# Patient Record
Sex: Female | Born: 2005 | Hispanic: Refuse to answer | Marital: Single | Smoking: Never smoker
Health system: Southern US, Community
[De-identification: ages and names within clinical notes are randomized; demographics above are authoritative.]

## PROBLEM LIST (undated history)

## (undated) DIAGNOSIS — L309 Dermatitis, unspecified: Secondary | ICD-10-CM

## (undated) DIAGNOSIS — E079 Disorder of thyroid, unspecified: Secondary | ICD-10-CM

## (undated) DIAGNOSIS — E7800 Pure hypercholesterolemia, unspecified: Secondary | ICD-10-CM

## (undated) DIAGNOSIS — N83209 Unspecified ovarian cyst, unspecified side: Secondary | ICD-10-CM

## (undated) DIAGNOSIS — J45909 Unspecified asthma, uncomplicated: Secondary | ICD-10-CM

## (undated) HISTORY — PX: OOPHORECTOMY: SHX86

---
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
## 2024-01-11 ENCOUNTER — Ambulatory Visit (INDEPENDENT_AMBULATORY_CARE_PROVIDER_SITE_OTHER): Admitting: "Endocrinology

## 2024-01-11 ENCOUNTER — Encounter: Payer: Self-pay | Admitting: "Endocrinology

## 2024-01-11 VITALS — BP 102/70 | HR 85 | Ht 65.5 in | Wt 243.0 lb

## 2024-01-11 DIAGNOSIS — E782 Mixed hyperlipidemia: Secondary | ICD-10-CM

## 2024-01-11 DIAGNOSIS — Z87898 Personal history of other specified conditions: Secondary | ICD-10-CM | POA: Diagnosis not present

## 2024-01-11 DIAGNOSIS — E221 Hyperprolactinemia: Secondary | ICD-10-CM | POA: Diagnosis not present

## 2024-01-11 DIAGNOSIS — E66812 Obesity, class 2: Secondary | ICD-10-CM

## 2024-01-11 DIAGNOSIS — Z6839 Body mass index (BMI) 39.0-39.9, adult: Secondary | ICD-10-CM

## 2024-01-11 DIAGNOSIS — Z8349 Family history of other endocrine, nutritional and metabolic diseases: Secondary | ICD-10-CM | POA: Diagnosis not present

## 2024-01-11 LAB — PROLACTIN: Prolactin: 13 ng/mL

## 2024-01-11 NOTE — Progress Notes (Signed)
 Outpatient Endocrinology Note Jamie Birmingham, MD    Jamie Davidson 04/03/2006 [id number]  Referring Provider: Willo Rosina Davidson* Primary Care Provider: Doreene Davidson PARAS, MD Reason for consultation: Subjective   Assessment & Plan  Diagnoses and all orders for this visit:  Hyperprolactinemia -     Prolactin  Family history of thyroid  disease  Mixed hypercholesterolemia and hypertriglyceridemia  History of prediabetes  Class 2 severe obesity due to excess calories with serious comorbidity and body mass index (BMI) of 39.0 to 39.9 in adult   She also reported galactorrhea in the past with mild hyperprolactinemia since 12/2022 Of note, 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   She had negative thyroid  antibodies.  She has an aunt with thyroid  problem per records, patient doesn't recall.  Recommend annual TSH with PCP  Has mixed hyperlipidemia and history of pre-diabetes  She is not currently on any lipid/diabetes meds.  Recommend low fat, low carb diet and activity  Seen nutritionist in past, not interested now Maintain healthy lifestyle including 1200 Cal/day, 30 min of activity/day, avoiding refined/processed/outside food 20 minutes physical activity per day, in continuum or interruptedly through the day  Goals: less than 60 grams of carbohydrate/meal, 1200-1500 Cal/day, 10,0000 steps a day and weight loss of 0.5-1 lb/ wk  Sleep 7-9 hours/day, adapt good sleep hygiene Adapt de-stressing and relaxation techniques to prevent stress induced weight gain Avoid/switch medications that lead to weight gain by discussing with the prescribing physician     Lab Results  Component Value Date   HGBA1C 5.6 06/23/2023   HGBA1C 5.9 (H)  12/25/2022   HGBA1C 5.7 (H) 06/22/2022  Lipid Panel     Component Value Date/Time   CHOL 193 (H) 06/23/2023 0927   TRIG 131 (H) 06/23/2023 0927   HDL 40 (L) 06/23/2023 0927   CHOLHDL 4.8 06/23/2023 0927   LDLCALC 128 (H) 06/23/2023 0927     Return in about 3 months (around 04/11/2024) for visit, labs today.   I have reviewed current medications, nurse's notes, allergies, vital signs, past medical and surgical history, family medical history, and social history for this encounter. Counseled patient on symptoms, examination findings, lab findings, imaging results, treatment decisions and monitoring and prognosis. The patient understood the recommendations and agrees with the treatment plan. All questions regarding treatment plan were fully answered.  Jamie Birmingham, MD  01/11/24   History of Present Illness HPI  Jamie Davidson is a 18 y.o. year old female who presents for evaluation of hyperprolactinemia, history of pre-diabetes   Has history of Hashimoto's but never been on thyroid  medication Has history of minimal breast discharge at one point, mildly high prolactin, none now Reports some headaches, wears glasses and reports vision issues, due  S/p B/L oophorectomy (2019, 2022) for ovarian cyst/bilat borderline tumor on estradiol  and progesterone replacement (managed by GYN)  Has seen a nutritionist  Physical Exam  BP 102/70   Pulse 85   Ht 5' 5.5 (1.664 m)   Wt 243 lb (110.2 kg)   SpO2 96%   BMI 39.82 kg/m    Constitutional: well developed, well nourished Head: normocephalic, atraumatic Eyes: sclera anicteric, no redness Neck: supple Lungs: normal respiratory effort Neurology: alert and oriented Skin: dry, no appreciable rashes Musculoskeletal: no appreciable defects Psychiatric:  normal mood and affect   Current Medications Patient's Medications  New Prescriptions   No medications on file  Previous Medications   CETIRIZINE (ZYRTEC) 10 MG TABLET     Take 10 mg by mouth at bedtime.   CHOLECALCIFEROL 1.25 MG (50000 UT) CAPSULE    take 1 capsule by mouth once weekly for 6 weeks   ESTRADIOL  (CLIMARA  - DOSED IN MG/24 HR) 0.0375 MG/24HR PATCH    0.0375 mg once a week.   FLUTICASONE (FLONASE) 50 MCG/ACT NASAL SPRAY    Place into both nostrils.   NORETHINDRONE  (AYGESTIN ) 5 MG TABLET    Take 5 mg by mouth.   PROCHLORPERAZINE (COMPAZINE) 10 MG TABLET    Take 10 mg by mouth every 6 (six) hours as needed for refractory nausea / vomiting.   VENTOLIN  HFA 108 (90 BASE) MCG/ACT INHALER    Inhale 2 puffs into the lungs every 4 (four) hours as needed.  Modified Medications   No medications on file  Discontinued Medications   No medications on file    Allergies Allergies  Allergen Reactions   Cat Dander    Dog Epithelium (Canis Lupus Familiaris)    Other     pollen   Pollen Extract     Past Medical History Past Medical History:  Diagnosis Date   Asthma    Eczema    Elevated cholesterol    Ovarian cyst    Thyroid  disease     Past Surgical History Past Surgical History:  Procedure Laterality Date   OOPHORECTOMY      Family History family history includes Epilepsy in her maternal grandmother.  Social History Social History   Socioeconomic History   Marital status: Single    Spouse name: Not on file   Number of children: Not on file   Years of education: Not on file   Highest education level: Not on file  Occupational History   Not on file  Tobacco Use   Smoking status: Never   Smokeless tobacco: Never  Substance and Sexual Activity   Alcohol use: No   Drug use: No   Sexual activity: Not on file  Other Topics Concern   Not on file  Social History Narrative   She attends [employer] -24-25 school year      She lives with mom, dad, younger sister, and brother.    Social Drivers of Corporate investment banker Strain: Not on File (07/31/2021)   Received from [employer]    Financial Resource Strain: 0   Food Insecurity: Not at Risk (09/08/2022)   Received from [employer]    Food: 1  Transportation Needs: Not at Risk (09/08/2022)   Received from [employer] [facility]    Transportation: 1  Physical Activity: Not on File (07/31/2021)   Received from [facility]   Physical Activity    Physical Activity: 0  Stress: Not on File (07/31/2021)   Received from [facility]   Stress    Stress: 0  Social Connections: Not on File (12/21/2022)   Received from [facility]   Social Connections    Connectedness: 0  Intimate Partner Violence: Not on file    Lab Results  Component Value Date   CHOL 193 (H) 06/23/2023   Lab Results  Component Value Date   HDL 40 (L) 06/23/2023   Lab Results  Component Value Date   LDLCALC 128 (H) 06/23/2023   Lab Results  Component Value Date  TRIG 131 (H) 06/23/2023   Lab Results  Component Value Date   CHOLHDL 4.8 06/23/2023   Lab Results  Component Value Date   CREATININE 0.68 12/25/2022   No results found for: GFR    Component Value Date/Time   NA 138 12/25/2022 0811   K 4.4 12/25/2022 0811   CL 104 12/25/2022 0811   CO2 23 12/25/2022 0811   GLUCOSE 83 12/25/2022 0811   BUN 10 12/25/2022 0811   CREATININE 0.68 12/25/2022 0811   CALCIUM 9.0 12/25/2022 0811   PROT 7.1 12/25/2022 0811   ALBUMIN 3.8 10/06/2020 1130   AST 14 12/25/2022 0811   ALT 17 12/25/2022 0811   ALKPHOS 89 10/06/2020 1130   BILITOT 0.4 12/25/2022 0811   GFRNONAA NOT CALCULATED 10/06/2020 1108   GFRAA NOT CALCULATED 04/25/2018 1320      Latest Ref Rng & Units 12/25/2022    8:11 AM 10/06/2020   11:08 AM 04/25/2018    1:20 PM  BMP  Glucose 65 - 139 mg/dL 83  892  87   BUN 7 - 20 mg/dL 10  8  7    Creatinine 0.50 - 0.96 mg/dL 9.31  9.40  9.38   BUN/Creat Ratio 6 - 22 (calc) SEE NOTE:     Sodium 135 - 146 mmol/L 138  137  139   Potassium 3.8 - 5.1 mmol/L 4.4  4.0  3.6   Chloride 98 - 110 mmol/L 104  104  109   CO2 20 - 32 mmol/L 23  23  22    Calcium 8.9 - 10.4  mg/dL 9.0  9.1  9.0        Component Value Date/Time   WBC 11.5 10/06/2020 1108   RBC 4.89 10/06/2020 1108   HGB 13.2 10/06/2020 1108   HCT 41.2 10/06/2020 1108   PLT 360 10/06/2020 1108   MCV 84.3 10/06/2020 1108   MCH 27.0 10/06/2020 1108   MCHC 32.0 10/06/2020 1108   RDW 14.6 10/06/2020 1108   LYMPHSABS 2.4 10/06/2020 1108   MONOABS 0.6 10/06/2020 1108   EOSABS 0.1 10/06/2020 1108   BASOSABS 0.1 10/06/2020 1108   Lab Results  Component Value Date   TSH 5.46 (H) 06/23/2023   TSH 4.29 12/25/2022   TSH 4.02 06/22/2022   FREET4 1.1 06/23/2023   FREET4 1.0 12/25/2022   FREET4 1.0 06/22/2022         Parts of this note may have been dictated using voice recognition software. There may be variances in spelling and vocabulary which are unintentional. Not all errors are proofread. Please notify the dino if any discrepancies are noted or if the meaning of any statement is not clear.

## 2024-01-11 NOTE — Patient Instructions (Signed)
 10-Point Nutrition Plan  Control portions of carbohydrate - 30 grams/meal, 15 grams/snack. Choose low-glycemic carbohydrates. Avoid high-glycemic carbohydrates. Include (heart-healthy) fats in each meal or snack - 15 grams/meal, 5 grams/snack. Emphasize optimal protein intake. Space meals/snacks 3-4 hours apart. Avoid consuming liquids with meals. Avoid alcohol. Avoid caffeine. Maintain post-bariatric vitamin and mineral intake.    Low Glycemic Index Carbohydrates (CHOOSE) Steel-cut oats (regular, not quick-cook or instant) Oat bran cereal Beans/legumes (e.g., garbanzo, navy, kidney, lima, pinto, black-eyed and pea beans, edamame (soybeans), lentils Bean products (e.g., hummus, tofu) Pearled barley, cooked al dente Yams Some fruits (e.g., grapefruit, apples, pears, berries, apricots, peaches) Some pasta (e.g., Barilla Plus pasta), cooked al dente Some whole grain breads (e.g., Ezekiel bread, Joseph's Flax, Oat Bran & Whole Wheat Pita/Lavash/Tortillas) Some whole grain crackers (e.g., RyKrisp, RyVita, Wasa) Brown rice, wild rice Quinoa  Buckwheat (a grass)   High Glycemic Index Carbohydrates (AVOID) Refined breakfast cereals (e.g., Corn Flakes, Rice Krispies, Cream of Rice, instant oatmeal) Regular pasta Most starchy vegetables (e.g., white potatoes, corn, winter (orange) squash) White rice, rice cakes Popcorn, pretzels, chips Some fruits (e.g., ripe bananas, pineapple, mango, watermelon, grapes) All fruit juices and sweetened drinks (e.g., sodas, sweetened iced tea) Bread, rolls, bagels, English muffins, and crackers made with refined flour Sweets (e.g., candy, cake, cookies, ice cream, syrup)   Heart-Healthy Fats (Adapt) Nuts, nut butters Avocado, guacamole Olives Most plant oils (e.g., olive, canola, peanut, soy, sunflower, sesame) Most seeds (e.g., sunflower, flax, sesame/sesame tahini) Oily fish (e.g., salmon, bluefish, mackerel, tuna, sardines)

## 2024-04-11 ENCOUNTER — Encounter: Payer: Self-pay | Admitting: "Endocrinology

## 2024-04-11 ENCOUNTER — Telehealth: Admitting: "Endocrinology

## 2024-04-11 DIAGNOSIS — E6609 Other obesity due to excess calories: Secondary | ICD-10-CM | POA: Diagnosis not present

## 2024-04-11 DIAGNOSIS — Z8349 Family history of other endocrine, nutritional and metabolic diseases: Secondary | ICD-10-CM | POA: Diagnosis not present

## 2024-04-11 DIAGNOSIS — E782 Mixed hyperlipidemia: Secondary | ICD-10-CM

## 2024-04-11 NOTE — Progress Notes (Signed)
 "  The patient reports they are currently: [name]. I spent 10-11 minutes on the  with the patient on the date of service. I spent an additional 2 minutes on pre- and post-visit activities on the date of service.   The patient was physically located in [place]  or a state in which I am permitted to provide care. The patient and/or parent/guardian understood that s/he may incur co-pays and cost sharing, and agreed to the telemedicine visit. The visit was reasonable and appropriate under the circumstances given the patient's presentation at the time.  The patient and/or parent/guardian understands the potential risks and limitations of this mode of treatment (including, but not limited to, the absence of in-person examination) and has agreed to be treated using telemedicine. The patient's/patient's family's questions regarding telemedicine have been answered.   The patient and/or parent/guardian will contact their provider's office for worsening conditions, and seek emergency medical treatment and/or call 911 if the patient deems either necessary.     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   She had negative thyroid  antibodies.  She has an aunt with thyroid  problem per records, patient doesn't recall.   Recommend annual TSH   Has mixed hyperlipidemia and history of pre-diabetes  She is not currently on any lipid/diabetes meds, and not interested in weight loss medication  Recommend low fat, low carb diet and activity  Discussed [employer] app, alongwith licensed conveyancer app that she uses Seen nutritionist in past, not interested now Maintain healthy lifestyle including 1200 Cal/day, 30 min of activity/day, avoiding refined/processed/outside food 20 minutes physical activity per day, in continuum or interruptedly through the day  Goals: less than 60 grams of carbohydrate/meal, 1200-1500 Cal/day, 10,0000 steps a day and weight loss of 0.5-1 lb/ wk  Sleep 7-9 hours/day, adapt good sleep hygiene Adapt de-stressing and relaxation techniques to prevent stress induced weight gain Avoid/switch medications that lead to weight gain by discussing with the prescribing physician     Lab Results  Component Value Date   HGBA1C 5.6 06/23/2023   HGBA1C 5.9 (H) 12/25/2022   HGBA1C 5.7 (H) 06/22/2022  Lipid Panel     Component Value Date/Time   CHOL 193 (H) 06/23/2023 0927   TRIG 131 (H) 06/23/2023 0927   HDL 40 (L) 06/23/2023 0927   CHOLHDL 4.8 06/23/2023 0927   LDLCALC 128 (H) 06/23/2023 0927     Return in about 6 months (around 10/10/2024) for visit.   I have reviewed current medications, nurse's notes, allergies, vital signs, past medical and surgical history, family medical history, and social history for this encounter. Counseled patient on symptoms, examination findings, lab findings, imaging results, treatment decisions and monitoring and prognosis. The patient understood the recommendations and agrees with the treatment plan. All questions regarding treatment plan were fully answered.  Jamie Birmingham, MD  04/11/2024   History of Present Illness HPI  Jamie Davidson is a 18 y.o. year old female who presents for evaluation of hyperprolactinemia, history of pre-diabetes.  Reports  seasonal sickness/cold Denies any breast discharge Has been cutting down on coffee, bread, cookies, juice Doesn't have a weighing scale Not counting calories consistently Doing 20 min stationary bike  Initial history: Has history of Hashimoto's but never been on thyroid  medication Has history of minimal breast discharge at one point, mildly high prolactin, none now Reports some headaches, wears glasses and reports vision issues, due  S/p B/L oophorectomy (2019, 2022) for ovarian cyst/bilat borderline tumor on estradiol  and progesterone replacement (managed by GYN)  Has seen a nutritionist  Physical Exam  There were no vitals taken for this visit.   Constitutional: well developed, well nourished Head: normocephalic, atraumatic Eyes: sclera anicteric, no redness Neck: supple Lungs: normal respiratory effort Neurology: alert and oriented Skin: dry, no appreciable rashes Musculoskeletal: no appreciable defects Psychiatric: normal mood and affect   Current Medications Patient's Medications  New Prescriptions   No medications on file  Previous Medications   CETIRIZINE (ZYRTEC) 10 MG TABLET    Take 10 mg by mouth at bedtime.   CHOLECALCIFEROL 1.25 MG (50000 UT) CAPSULE    take 1 capsule by mouth once weekly for 6 weeks   ESTRADIOL  (CLIMARA  - DOSED IN MG/24 HR) 0.0375 MG/24HR PATCH    0.0375 mg once a week.   FLUTICASONE (FLONASE) 50 MCG/ACT NASAL SPRAY    Place into both nostrils.   NORETHINDRONE  (AYGESTIN ) 5 MG TABLET    Take 5 mg by mouth.   VENTOLIN  HFA 108 (90 BASE) MCG/ACT INHALER    Inhale 2 puffs into the lungs every 4 (four) hours as needed.  Modified Medications   No medications on file  Discontinued Medications   PROCHLORPERAZINE (COMPAZINE) 10 MG TABLET    Take 10 mg by mouth every 6 (six) hours as needed for refractory nausea / vomiting.    Allergies Allergies  Allergen Reactions   Cat Dander    Dog Epithelium (Canis Lupus Familiaris)    Other     pollen    Pollen Extract     Past Medical History Past Medical History:  Diagnosis Date   Asthma    Eczema    Elevated cholesterol    Ovarian cyst    Thyroid  disease     Past Surgical History Past Surgical History:  Procedure Laterality Date   OOPHORECTOMY      Family History family history includes Epilepsy in her maternal grandmother.  Social History Social History   Socioeconomic History   Marital status: Single    Spouse name: Not on file   Number of children: Not on file   Years of education: Not on file   Highest education level: Not on file  Occupational History   Not on file  Tobacco Use   Smoking status: Never   Smokeless tobacco: Never  Substance and Sexual Activity   Alcohol use: No   Drug use: No   Sexual activity: Not on file  Other Topics Concern   Not on file  Social History Narrative   She attends [employer] -24-25 school year      She lives with mom, dad, younger sister, and brother.    Social Drivers of Health   Tobacco Use: Low Risk (04/11/2024)   Patient History    Smoking Tobacco Use: Never    Smokeless Tobacco Use: Never    Passive Exposure: Not on file  Financial Resource Strain:  Not on File (07/31/2021)   Received from [facility]    Financial Resource Strain: 0  Food Insecurity: Not at Risk (09/08/2022)   Received from [facility] Insecurity    Food: 1  Transportation Needs: Not at Risk (09/08/2022)   Received from [facility] Needs    Transportation: 1  Physical Activity: Not on File (07/31/2021)   Received from [facility]   Physical Activity    Physical Activity: 0  Stress: Not on File (07/31/2021)   Received from [facility]   Stress    Stress: 0  Social Connections: Not on File (12/21/2022)   Received from [facility]   Social Connections    Connectedness: 0  Intimate Partner Violence: Not on file  Depression (PHQ2-9): Not on file  Alcohol Screen: Not on file  Housing: Not on file  Utilities: Not on file  Health  Literacy: Not on file    Lab Results  Component Value Date   CHOL 193 (H) 06/23/2023   Lab Results  Component Value Date   HDL 40 (L) 06/23/2023   Lab Results  Component Value Date   LDLCALC 128 (H) 06/23/2023   Lab Results  Component Value Date   TRIG 131 (H) 06/23/2023   Lab Results  Component Value Date   CHOLHDL 4.8 06/23/2023   Lab Results  Component Value Date   CREATININE 0.68 12/25/2022   No results found for: GFR    Component Value Date/Time   NA 138 12/25/2022 0811   K 4.4 12/25/2022 0811   CL 104 12/25/2022 0811   CO2 23 12/25/2022 0811   GLUCOSE 83 12/25/2022 0811   BUN 10 12/25/2022 0811   CREATININE 0.68 12/25/2022 0811   CALCIUM 9.0 12/25/2022 0811   PROT 7.1 12/25/2022 0811   ALBUMIN 3.8 10/06/2020 1130   AST 14 12/25/2022 0811   ALT 17 12/25/2022 0811   ALKPHOS 89 10/06/2020 1130   BILITOT 0.4 12/25/2022 0811   GFRNONAA NOT CALCULATED 10/06/2020 1108   GFRAA NOT CALCULATED 04/25/2018 1320      Latest Ref Rng & Units 12/25/2022    8:11 AM 10/06/2020   11:08 AM 04/25/2018    1:20 PM  BMP  Glucose 65 - 139 mg/dL 83  892  87   BUN 7 - 20 mg/dL 10  8  7    Creatinine 0.50 - 0.96 mg/dL 9.31  9.40  9.38   BUN/Creat Ratio 6 - 22 (calc) SEE NOTE:     Sodium 135 - 146 mmol/L 138  137  139   Potassium 3.8 - 5.1 mmol/L 4.4  4.0  3.6   Chloride 98 - 110 mmol/L 104  104  109   CO2 20 - 32 mmol/L 23  23  22    Calcium 8.9 - 10.4 mg/dL 9.0  9.1  9.0        Component Value Date/Time   WBC 11.5 10/06/2020 1108   RBC 4.89 10/06/2020 1108   HGB 13.2 10/06/2020 1108   HCT 41.2 10/06/2020 1108   PLT 360 10/06/2020 1108   MCV 84.3 10/06/2020 1108   MCH 27.0 10/06/2020 1108   MCHC 32.0 10/06/2020 1108   RDW 14.6 10/06/2020 1108   LYMPHSABS 2.4 10/06/2020 1108   MONOABS 0.6 10/06/2020 1108   EOSABS 0.1 10/06/2020 1108   BASOSABS 0.1 10/06/2020 1108   Lab Results  Component Value Date   TSH 5.46 (H) 06/23/2023   TSH 4.29 12/25/2022  TSH 4.02  06/22/2022   FREET4 1.1 06/23/2023   FREET4 1.0 12/25/2022   FREET4 1.0 06/22/2022         Parts of this note may have been dictated using voice recognition software. There may be variances in spelling and vocabulary which are unintentional. Not all errors are proofread. Please notify the dino if any discrepancies are noted or if the meaning of any statement is not clear.   "

## 2024-04-11 NOTE — Patient Instructions (Signed)
 Recommend low fat, low carb diet and activity  Seen nutritionist in past, not interested now Maintain healthy lifestyle including 1200 Cal/day, 30 min of activity/day, avoiding refined/processed/outside food 20 minutes physical activity per day, in continuum or interruptedly through the day  Goals: less than 60 grams of carbohydrate/meal, 1200-1500 Cal/day, 10,0000 steps a day and weight loss of 0.5-1 lb/ wk  Sleep 7-9 hours/day, adapt good sleep hygiene Adapt de-stressing and relaxation techniques to prevent stress induced weight gain Avoid/switch medications that lead to weight gain by discussing with the prescribing physician

## 2024-10-10 ENCOUNTER — Ambulatory Visit: Admitting: "Endocrinology
